# Patient Record
Sex: Male | Born: 1995 | Race: White | Hispanic: No
Health system: Southern US, Community
[De-identification: ages and names within clinical notes are randomized; demographics above are authoritative.]

## PROBLEM LIST (undated history)

## (undated) DIAGNOSIS — K219 Gastro-esophageal reflux disease without esophagitis: Secondary | ICD-10-CM

## (undated) HISTORY — DX: Gastro-esophageal reflux disease without esophagitis: K21.9

---
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
## 2019-05-29 ENCOUNTER — Other Ambulatory Visit: Payer: Self-pay

## 2019-05-29 ENCOUNTER — Ambulatory Visit: Payer: BLUE CROSS/BLUE SHIELD | Attending: Internal Medicine

## 2019-05-29 DIAGNOSIS — Z23 Encounter for immunization: Secondary | ICD-10-CM

## 2019-05-29 NOTE — Progress Notes (Signed)
   Covid-19 Vaccination Clinic  Name:  Jaquez Farrington [facility]    MRN: [id number] DOB: 06/24/1995  05/29/2019  Mr. Mckinney was observed post Covid-19 immunization for 15 minutes without incident. He was provided with Vaccine Information Sheet and instruction to access the V-Safe system.   Mr. Lienhard was instructed to call 911 with any severe reactions post vaccine: Marland Kitchen Difficulty breathing  . Swelling of face and throat  . A fast heartbeat  . A bad rash all over body  . Dizziness and weakness   Immunizations Administered    Name Date Dose VIS Date Route   Pfizer COVID-19 Vaccine 05/29/2019  9:43 AM 0.3 mL 02/06/2019 Intramuscular   Manufacturer: [employer], [employer]   Lot: [id number]   NDC: [address]-9

## 2019-06-23 ENCOUNTER — Ambulatory Visit: Payer: BLUE CROSS/BLUE SHIELD | Attending: Internal Medicine

## 2019-06-23 DIAGNOSIS — Z23 Encounter for immunization: Secondary | ICD-10-CM

## 2019-06-23 NOTE — Progress Notes (Signed)
.     Covid-19 Vaccination Clinic  Name:  Bomani Oommen [facility]    MRN: [id number] DOB: 05/01/1995  06/23/2019  Mr. Tunison was observed post Covid-19 immunization for 15 minutes without incident. He was provided with Vaccine Information Sheet and instruction to access the V-Safe system.   Mr. Schwandt was instructed to call 911 with any severe reactions post vaccine: Marland Kitchen Difficulty breathing  . Swelling of face and throat  . A fast heartbeat  . A bad rash all over body  . Dizziness and weakness   Immunizations Administered    Name Date Dose VIS Date Route   Pfizer COVID-19 Vaccine 06/23/2019 11:32 AM 0.3 mL 04/22/2018 Intramuscular   Manufacturer: [employer], [employer]   Lot: [id number]   NDC: [address]-1

## 2021-11-15 DIAGNOSIS — Z03818 Encounter for observation for suspected exposure to other biological agents ruled out: Secondary | ICD-10-CM | POA: Diagnosis not present

## 2021-11-15 DIAGNOSIS — J029 Acute pharyngitis, unspecified: Secondary | ICD-10-CM | POA: Diagnosis not present

## 2021-11-15 DIAGNOSIS — R059 Cough, unspecified: Secondary | ICD-10-CM | POA: Diagnosis not present

## 2021-11-15 DIAGNOSIS — Z6825 Body mass index (BMI) 25.0-25.9, adult: Secondary | ICD-10-CM | POA: Diagnosis not present

## 2022-05-03 DIAGNOSIS — F17210 Nicotine dependence, cigarettes, uncomplicated: Secondary | ICD-10-CM | POA: Diagnosis not present

## 2022-05-03 DIAGNOSIS — R112 Nausea with vomiting, unspecified: Secondary | ICD-10-CM | POA: Diagnosis not present

## 2022-05-03 DIAGNOSIS — S0219XA Other fracture of base of skull, initial encounter for closed fracture: Secondary | ICD-10-CM | POA: Diagnosis not present

## 2022-05-03 DIAGNOSIS — S062X0A Diffuse traumatic brain injury without loss of consciousness, initial encounter: Secondary | ICD-10-CM | POA: Diagnosis not present

## 2022-05-03 DIAGNOSIS — I609 Nontraumatic subarachnoid hemorrhage, unspecified: Secondary | ICD-10-CM | POA: Diagnosis not present

## 2022-05-03 DIAGNOSIS — R519 Headache, unspecified: Secondary | ICD-10-CM | POA: Diagnosis not present

## 2022-05-03 DIAGNOSIS — Z043 Encounter for examination and observation following other accident: Secondary | ICD-10-CM | POA: Diagnosis not present

## 2022-05-03 DIAGNOSIS — Y9351 Activity, roller skating (inline) and skateboarding: Secondary | ICD-10-CM | POA: Diagnosis not present

## 2022-05-03 DIAGNOSIS — S065X0A Traumatic subdural hemorrhage without loss of consciousness, initial encounter: Secondary | ICD-10-CM | POA: Diagnosis not present

## 2022-05-03 DIAGNOSIS — W19XXXA Unspecified fall, initial encounter: Secondary | ICD-10-CM | POA: Diagnosis not present

## 2022-05-03 DIAGNOSIS — S065XAA Traumatic subdural hemorrhage with loss of consciousness status unknown, initial encounter: Secondary | ICD-10-CM | POA: Diagnosis not present

## 2022-05-03 DIAGNOSIS — I611 Nontraumatic intracerebral hemorrhage in hemisphere, cortical: Secondary | ICD-10-CM | POA: Diagnosis not present

## 2022-05-03 DIAGNOSIS — I6200 Nontraumatic subdural hemorrhage, unspecified: Secondary | ICD-10-CM | POA: Diagnosis not present

## 2022-05-03 DIAGNOSIS — S066X0A Traumatic subarachnoid hemorrhage without loss of consciousness, initial encounter: Secondary | ICD-10-CM | POA: Diagnosis not present

## 2022-05-03 DIAGNOSIS — S0291XA Unspecified fracture of skull, initial encounter for closed fracture: Secondary | ICD-10-CM | POA: Diagnosis not present

## 2022-05-04 ENCOUNTER — Telehealth: Payer: Self-pay

## 2022-05-04 DIAGNOSIS — S065XAA Traumatic subdural hemorrhage with loss of consciousness status unknown, initial encounter: Secondary | ICD-10-CM | POA: Diagnosis not present

## 2022-05-04 DIAGNOSIS — I609 Nontraumatic subarachnoid hemorrhage, unspecified: Secondary | ICD-10-CM | POA: Diagnosis not present

## 2022-05-04 DIAGNOSIS — S0291XA Unspecified fracture of skull, initial encounter for closed fracture: Secondary | ICD-10-CM | POA: Diagnosis not present

## 2022-05-04 DIAGNOSIS — I6200 Nontraumatic subdural hemorrhage, unspecified: Secondary | ICD-10-CM | POA: Diagnosis not present

## 2022-05-04 DIAGNOSIS — I611 Nontraumatic intracerebral hemorrhage in hemisphere, cortical: Secondary | ICD-10-CM | POA: Diagnosis not present

## 2022-05-04 NOTE — Telephone Encounter (Signed)
Sent urgent powershare request to [facility], waiting on response.
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
## 2022-05-04 NOTE — Telephone Encounter (Signed)
CT ordered. Eric Roberts the phone # to schedule CT to be done around 05/18/22. Appt made with Eric Roberts for 05/22/22. Eric Roberts that they should return to the ER if he has any worsening symptoms. She is going to try to send a picture of his insurance card through [place] because he has [place] now.

## 2022-05-07 ENCOUNTER — Ambulatory Visit
Admission: RE | Admit: 2022-05-07 | Discharge: 2022-05-07 | Disposition: A | Discharge status: Home / Self Care | Payer: Self-pay | Source: Ambulatory Visit | Attending: Neurosurgery | Admitting: Neurosurgery

## 2022-05-07 ENCOUNTER — Emergency Department: Payer: 59

## 2022-05-07 ENCOUNTER — Emergency Department
Admission: EM | Admit: 2022-05-07 | Discharge: 2022-05-08 | Disposition: A | Discharge status: Home / Self Care | Payer: 59 | Attending: Emergency Medicine | Admitting: Emergency Medicine

## 2022-05-07 ENCOUNTER — Other Ambulatory Visit: Payer: Self-pay

## 2022-05-07 DIAGNOSIS — Y900 Blood alcohol level of less than 20 mg/100 ml: Secondary | ICD-10-CM | POA: Diagnosis not present

## 2022-05-07 DIAGNOSIS — S0990XA Unspecified injury of head, initial encounter: Secondary | ICD-10-CM | POA: Diagnosis not present

## 2022-05-07 DIAGNOSIS — X58XXXA Exposure to other specified factors, initial encounter: Secondary | ICD-10-CM | POA: Insufficient documentation

## 2022-05-07 DIAGNOSIS — R0689 Other abnormalities of breathing: Secondary | ICD-10-CM | POA: Diagnosis not present

## 2022-05-07 DIAGNOSIS — R569 Unspecified convulsions: Secondary | ICD-10-CM | POA: Insufficient documentation

## 2022-05-07 DIAGNOSIS — S0219XA Other fracture of base of skull, initial encounter for closed fracture: Secondary | ICD-10-CM | POA: Diagnosis not present

## 2022-05-07 DIAGNOSIS — I629 Nontraumatic intracranial hemorrhage, unspecified: Secondary | ICD-10-CM

## 2022-05-07 DIAGNOSIS — Z049 Encounter for examination and observation for unspecified reason: Secondary | ICD-10-CM

## 2022-05-07 DIAGNOSIS — R000 Tachycardia, unspecified: Secondary | ICD-10-CM | POA: Insufficient documentation

## 2022-05-07 DIAGNOSIS — R0902 Hypoxemia: Secondary | ICD-10-CM | POA: Diagnosis not present

## 2022-05-07 DIAGNOSIS — Y9351 Activity, roller skating (inline) and skateboarding: Secondary | ICD-10-CM | POA: Diagnosis not present

## 2022-05-07 DIAGNOSIS — S06360A Traumatic hemorrhage of cerebrum, unspecified, without loss of consciousness, initial encounter: Secondary | ICD-10-CM | POA: Diagnosis not present

## 2022-05-07 DIAGNOSIS — R404 Transient alteration of awareness: Secondary | ICD-10-CM | POA: Diagnosis not present

## 2022-05-07 DIAGNOSIS — I10 Essential (primary) hypertension: Secondary | ICD-10-CM | POA: Diagnosis not present

## 2022-05-07 DIAGNOSIS — R4182 Altered mental status, unspecified: Secondary | ICD-10-CM | POA: Diagnosis not present

## 2022-05-07 LAB — CBC WITH DIFFERENTIAL/PLATELET
Abs Immature Granulocytes: 0.09 10*3/uL — ABNORMAL HIGH (ref 0.00–0.07)
Basophils Absolute: 0.1 10*3/uL (ref 0.0–0.1)
Basophils Relative: 1 %
Eosinophils Absolute: 0.1 10*3/uL (ref 0.0–0.5)
Eosinophils Relative: 1 %
HCT: 52.5 % — ABNORMAL HIGH (ref 39.0–52.0)
Hemoglobin: 17.9 g/dL — ABNORMAL HIGH (ref 13.0–17.0)
Immature Granulocytes: 1 %
Lymphocytes Relative: 21 %
Lymphs Abs: 2.4 10*3/uL (ref 0.7–4.0)
MCH: 31.1 pg (ref 26.0–34.0)
MCHC: 34.1 g/dL (ref 30.0–36.0)
MCV: 91.1 fL (ref 80.0–100.0)
Monocytes Absolute: 0.9 10*3/uL (ref 0.1–1.0)
Monocytes Relative: 8 %
Neutro Abs: 7.6 10*3/uL (ref 1.7–7.7)
Neutrophils Relative %: 68 %
Platelets: 351 10*3/uL (ref 150–400)
RBC: 5.76 MIL/uL (ref 4.22–5.81)
RDW: 11.4 % — ABNORMAL LOW (ref 11.5–15.5)
WBC: 11.1 10*3/uL — ABNORMAL HIGH (ref 4.0–10.5)
nRBC: 0 % (ref 0.0–0.2)

## 2022-05-07 LAB — COMPREHENSIVE METABOLIC PANEL
ALT: 25 U/L (ref 0–44)
AST: 45 U/L — ABNORMAL HIGH (ref 15–41)
Albumin: 4.2 g/dL (ref 3.5–5.0)
Alkaline Phosphatase: 70 U/L (ref 38–126)
Anion gap: 25 — ABNORMAL HIGH (ref 5–15)
BUN: 17 mg/dL (ref 6–20)
CO2: 12 mmol/L — ABNORMAL LOW (ref 22–32)
Calcium: 9.5 mg/dL (ref 8.9–10.3)
Chloride: 95 mmol/L — ABNORMAL LOW (ref 98–111)
Creatinine, Ser: 1.36 mg/dL — ABNORMAL HIGH (ref 0.61–1.24)
GFR, Estimated: >60 mL/min (ref >60)
Glucose, Bld: 139 mg/dL — ABNORMAL HIGH (ref 70–99)
Potassium: 3.2 mmol/L — ABNORMAL LOW (ref 3.5–5.1)
Sodium: 132 mmol/L — ABNORMAL LOW (ref 135–145)
Total Bilirubin: 1.8 mg/dL — ABNORMAL HIGH (ref 0.3–1.2)
Total Protein: 8.1 g/dL (ref 6.5–8.1)

## 2022-05-07 LAB — LACTIC ACID, PLASMA: Lactic Acid, Venous: >9 mmol/L (ref 0.5–1.9)

## 2022-05-07 LAB — ETHANOL: Alcohol, Ethyl (B): <10 mg/dL (ref <10)

## 2022-05-07 LAB — MAGNESIUM: Magnesium: 2.2 mg/dL (ref 1.7–2.4)

## 2022-05-07 MED ORDER — LEVETIRACETAM 500 MG PO TABS: 500.0000 mg | ORAL_TABLET | Freq: Two times a day (BID) | ORAL | 3 refills | Status: AC

## 2022-05-07 MED ORDER — ACETAMINOPHEN 500 MG PO TABS
1000.0000 mg | ORAL_TABLET | Freq: Once | ORAL | Status: AC
  Administered 2022-05-07: 1000 mg via ORAL
  Filled 2022-05-07: qty 2

## 2022-05-07 MED ORDER — SODIUM CHLORIDE 0.9 % IV SOLN: 2000.0000 mg | Freq: Once | INTRAVENOUS | Status: DC

## 2022-05-07 MED ORDER — OXYCODONE HCL 5 MG PO TABS
5.0000 mg | ORAL_TABLET | Freq: Once | ORAL | Status: AC
  Administered 2022-05-07: 5 mg via ORAL
  Filled 2022-05-07: qty 1

## 2022-05-07 MED ORDER — POTASSIUM CHLORIDE CRYS ER 20 MEQ PO TBCR
40.0000 meq | EXTENDED_RELEASE_TABLET | Freq: Once | ORAL | Status: AC
  Administered 2022-05-07: 40 meq via ORAL
  Filled 2022-05-07: qty 2

## 2022-05-07 MED ORDER — LEVETIRACETAM IN NACL 1000 MG/100ML IV SOLN
1000.0000 mg | Freq: Once | INTRAVENOUS | Status: AC
  Administered 2022-05-07: 1000 mg via INTRAVENOUS
  Filled 2022-05-07: qty 100

## 2022-05-07 NOTE — ED Notes (Signed)
Patient transported to CT 

## 2022-05-07 NOTE — ED Triage Notes (Signed)
Pt arrives via EMS from home for ongoing seizures. Pt sustained head injury with bleed last Thursday skateboarding. EMS said family reports 4 seizures prior to their arrival. EMS witnessed seizure 1 minute in duration and gave '5mg'$  versed IVP.  Pt arrives post ictal and confused. EMS reports glucose 117
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
## 2022-05-07 NOTE — Discharge Instructions (Addendum)
Do not drive, swim, use the bathtub, or do any other activities that may be dangerous if you lose consciousness or have another seizure.  Take Keppra which is an antiseizure medications twice a day as prescribed until you are able to follow-up with your neurologist and neurosurgeon.  Stay well-hydrated by drinking plenty of fluids.    Do not use drugs or alcohol.  Thank you for choosing [place] for your health care today!  Please see your primary doctor this week for a follow up appointment.   Sometimes, in the early stages of certain disease courses it is difficult to detect in the emergency department evaluation -- so, it is important that you continue to monitor your symptoms and call your doctor right away or return to the emergency department if you develop any new or worsening symptoms.  Please go to the following website to schedule new (and existing) patient appointments:   [email or web address]  If you do not have a primary doctor try calling the following clinics to establish care:  If you have insurance:  [facility] [phone] [place] [address]   [facility]  [phone] [address] [place] [address]   If you do not have insurance:  [facility]  [phone] [address] [place] [address]   The following is another list of primary care offices in the area who are accepting new patients at this time.  Please reach out to one of them directly and let them know you would like to schedule an appointment to follow up on an Emergency Department visit, and/or to establish a new primary care provider (PCP).  There are likely other primary care clinics in the are who are accepting new patients, but this is an excellent place to start:  [place] physician: Dr Lavon Paganini [address] [place], [place] [address] [phone]  [facility] Lead Physician: Dr Steele Sizer [address], [place], [place] [address] [phone]  [place] Physician: Dr Park Liter [address] [place], [place] [address] [phone]  [facility] Lead Physician: Dr Dewaine Oats [place], [place], [place] [address] [phone]  [facility] Physician: Dr Halina Maidens [address] [place], [place] [address] [phone]   It was my pleasure to care for you today.   [place] Brunette Jacelyn Grip, MD

## 2022-05-07 NOTE — Telephone Encounter (Signed)
Noted. I will work on the authorization.

## 2022-05-08 NOTE — Telephone Encounter (Signed)
I see he went to the ER 05/07/22 and had a repeat head CT at that time. They called Dr Izora Ribas about him.

## 2022-05-09 DIAGNOSIS — Z872 Personal history of diseases of the skin and subcutaneous tissue: Secondary | ICD-10-CM | POA: Diagnosis not present

## 2022-05-09 DIAGNOSIS — R569 Unspecified convulsions: Secondary | ICD-10-CM | POA: Diagnosis not present

## 2022-05-09 DIAGNOSIS — S0636AD Traumatic hemorrhage of cerebrum, unspecified, with loss of consciousness status unknown, subsequent encounter: Secondary | ICD-10-CM | POA: Diagnosis not present

## 2022-05-09 NOTE — Progress Notes (Signed)
Referring Physician:  No referring provider defined for this encounter.  Primary Physician:  Patient, No Pcp Per  History of Present Illness: 05/22/2022 [name] has a history of GERD.  Seen in ED on 05/07/22 for multiple seizures. He was seen in ED at [facility] on 05/04/22 for nondisplaced right sided skull fracture and intracranial bleeding right frontal lobe neuralgic contusion along with subdural and subarachnoid hemorrhage.   He was discharged on keppra and had repeat head CT. He was to follow up with neurology. He is here for follow up of head CT.   He saw neurology on 05/16/22 Eric Roberts)- he was to continue keppra and was started on nortriptyline.   He continues with a dull headache and intermittent dizziness. No vision changes. He has some intermittent neck and jaw pain. Overall, he is feeling much better. He noticed a lot of improvement after starting amitriptyline.   He smokes/vapes occasionally.   Review of Systems:  A 10 point review of systems is negative, except for the pertinent positives and negatives detailed in the HPI.  Past Medical History: Past Medical History:  Diagnosis Date   Gastroesophageal reflux     Past Surgical History: No past surgical history on file.  Allergies: Allergies as of 05/22/2022   (No Known Allergies)    Medications: Outpatient Encounter Medications as of 05/22/2022  Medication Sig   levETIRAcetam (KEPPRA) 500 MG tablet Take 1 tablet (500 mg total) by mouth 2 (two) times daily.   nortriptyline (PAMELOR) 10 MG capsule Take 10 mg at night for three weeks then increase to 20 mg at night and continue that dose   ondansetron (ZOFRAN) 4 MG tablet Take by mouth.   [DISCONTINUED] levETIRAcetam (KEPPRA) 500 MG tablet Take by mouth.   [EXPIRED] ondansetron (ZOFRAN-ODT) 4 MG disintegrating tablet Take 4 mg by mouth every 8 (eight) hours as needed.   [EXPIRED] oxyCODONE (OXY IR/ROXICODONE) 5 MG immediate release tablet Take 5 mg by mouth  every 4 (four) hours as needed.   No facility-administered encounter medications on file as of 05/22/2022.    Social History: Social History   Tobacco Use   Smoking status: Some Days    Packs/day: .25    Types: Cigarettes   Smokeless tobacco: Current   Tobacco comments:    Vape sometimes  Substance Use Topics   Alcohol use: No   Drug use: No    Family Medical History: Family History  Problem Relation Age of Onset   Cholelithiasis Mother    Pancreatitis Mother     Physical Examination: Vitals:   05/22/22 1256  BP: 121/78  Pulse: 94  SpO2: 98%    General: Patient is well developed, well nourished, calm, collected, and in no apparent distress. Attention to examination is appropriate.  Respiratory: Patient is breathing without any difficulty.   NEUROLOGICAL:     Awake, alert, oriented to person, place, and time.  Speech is clear and fluent. Fund of knowledge is appropriate.   Cranial Nerves: Pupils equal round and reactive to light.  Facial tone is symmetric.    Extraocular muscles are intact  Facial sensation is intact bilaterally  Facial strength is intact bilaterally  Tongue protrudes midline   No abnormal lesions on exposed skin.   Strength: Side Biceps Triceps Deltoid Interossei Grip Wrist Ext. Wrist Flex.  R 5 5 5 5 5 5 5   L 5 5 5 5 5 5 5    Side Iliopsoas Quads Hamstring PF DF EHL  R 5 5  5 5 5 5   L 5 5 5 5 5 5    Reflexes are 2+ and symmetric at the biceps, triceps, brachioradialis, patella and achilles.   Hoffman's is absent.  Clonus is not present.   Bilateral upper and lower extremity sensation is intact to light touch.     Gait is normal.    Medical Decision Making  Imaging: CT head dated 05/15/22:  FINDINGS: Brain: Interval mild decrease in conspicuity and hyperdensity of multiple hemorrhagic contusions. No evidence of progressive hemorrhage or mass effect. No evidence of acute large vascular territory infarct, hydrocephalus or mass  lesion.   Vascular: No hyperdense vessel.   Skull: Redemonstrated nondisplaced right temporoparietal fracture.   Sinuses/Orbits: Largely clear sinuses.  No acute orbital findings.   Other: No mastoid effusions.   IMPRESSION: Interval mild decrease in conspicuity and hyperdensity of multiple hemorrhagic contusions, compatible with expected evolution. No evidence of progressive hemorrhage or mass effect.     Electronically Signed   By: Margaretha Sheffield M.D.   On: 05/15/2022 15:38  I have personally reviewed the images and agree with the above interpretation.  Above CT reviewed by Dr. Izora Ribas.   Assessment and Plan: [name] is a pleasant 27 y.o. male has nondisplaced right sided skull fracture and intracranial bleeding right frontal lobe neuralgic contusion along with subdural and subarachnoid hemorrhage s/p skate boarding injury on 05/03/22.   Repeat CT on 05/15/22 looks good and was reviewed with Dr. Izora Ribas.   Treatment options discussed with patient and following plan made with Dr. Izora Ribas:   - No neurosurgical intervention needed.  - Continue to follow with neurology for seizures/concussion.  - He can follow up with [place] prn.   I spent a total of 25 minutes in face-to-face and non-face-to-face activities related to this patient's care today including review of outside records, review of imaging, review of symptoms, physical exam, discussion of differential diagnosis, discussion of treatment options, and documentation.   Thank you for involving me in the care of this patient.   Geronimo Boot PA-C Dept. of Neurosurgery

## 2022-05-15 ENCOUNTER — Ambulatory Visit
Admission: RE | Admit: 2022-05-15 | Discharge: 2022-05-15 | Disposition: A | Discharge status: Home / Self Care | Payer: 59 | Source: Ambulatory Visit | Attending: Neurosurgery | Admitting: Neurosurgery

## 2022-05-15 DIAGNOSIS — S065X0A Traumatic subdural hemorrhage without loss of consciousness, initial encounter: Secondary | ICD-10-CM | POA: Diagnosis not present

## 2022-05-15 DIAGNOSIS — S065XAA Traumatic subdural hemorrhage with loss of consciousness status unknown, initial encounter: Secondary | ICD-10-CM | POA: Diagnosis not present

## 2022-05-16 DIAGNOSIS — R569 Unspecified convulsions: Secondary | ICD-10-CM | POA: Diagnosis not present

## 2022-05-16 DIAGNOSIS — I629 Nontraumatic intracranial hemorrhage, unspecified: Secondary | ICD-10-CM | POA: Diagnosis not present

## 2022-05-16 DIAGNOSIS — G44311 Acute post-traumatic headache, intractable: Secondary | ICD-10-CM | POA: Diagnosis not present

## 2022-05-16 DIAGNOSIS — Z87820 Personal history of traumatic brain injury: Secondary | ICD-10-CM | POA: Diagnosis not present

## 2022-05-16 DIAGNOSIS — F0781 Postconcussional syndrome: Secondary | ICD-10-CM | POA: Diagnosis not present

## 2022-05-16 DIAGNOSIS — R110 Nausea: Secondary | ICD-10-CM | POA: Diagnosis not present

## 2022-05-16 DIAGNOSIS — Z8781 Personal history of (healed) traumatic fracture: Secondary | ICD-10-CM | POA: Diagnosis not present

## 2022-05-22 ENCOUNTER — Encounter: Payer: Self-pay | Admitting: Orthopedic Surgery

## 2022-05-22 ENCOUNTER — Ambulatory Visit: Payer: 59 | Admitting: Orthopedic Surgery

## 2022-05-22 VITALS — BP 121/78 | HR 94 | Ht 65.0 in | Wt 163.0 lb

## 2022-05-22 DIAGNOSIS — S065XAA Traumatic subdural hemorrhage with loss of consciousness status unknown, initial encounter: Secondary | ICD-10-CM

## 2022-05-31 DIAGNOSIS — Z8781 Personal history of (healed) traumatic fracture: Secondary | ICD-10-CM | POA: Diagnosis not present

## 2022-05-31 DIAGNOSIS — R110 Nausea: Secondary | ICD-10-CM | POA: Diagnosis not present

## 2022-05-31 DIAGNOSIS — R569 Unspecified convulsions: Secondary | ICD-10-CM | POA: Diagnosis not present

## 2022-05-31 DIAGNOSIS — G44311 Acute post-traumatic headache, intractable: Secondary | ICD-10-CM | POA: Diagnosis not present

## 2022-05-31 DIAGNOSIS — F0781 Postconcussional syndrome: Secondary | ICD-10-CM | POA: Diagnosis not present

## 2022-05-31 DIAGNOSIS — I629 Nontraumatic intracranial hemorrhage, unspecified: Secondary | ICD-10-CM | POA: Diagnosis not present

## 2022-06-23 DIAGNOSIS — R569 Unspecified convulsions: Secondary | ICD-10-CM | POA: Diagnosis not present

## 2022-07-19 DIAGNOSIS — R569 Unspecified convulsions: Secondary | ICD-10-CM | POA: Diagnosis not present

## 2022-07-30 DIAGNOSIS — R110 Nausea: Secondary | ICD-10-CM | POA: Diagnosis not present

## 2022-07-30 DIAGNOSIS — Z872 Personal history of diseases of the skin and subcutaneous tissue: Secondary | ICD-10-CM | POA: Diagnosis not present

## 2022-07-30 DIAGNOSIS — I629 Nontraumatic intracranial hemorrhage, unspecified: Secondary | ICD-10-CM | POA: Diagnosis not present

## 2022-07-30 DIAGNOSIS — G44311 Acute post-traumatic headache, intractable: Secondary | ICD-10-CM | POA: Diagnosis not present

## 2022-07-30 DIAGNOSIS — S0636AD Traumatic hemorrhage of cerebrum, unspecified, with loss of consciousness status unknown, subsequent encounter: Secondary | ICD-10-CM | POA: Diagnosis not present

## 2022-07-30 DIAGNOSIS — Z8781 Personal history of (healed) traumatic fracture: Secondary | ICD-10-CM | POA: Diagnosis not present

## 2022-07-30 DIAGNOSIS — Z87820 Personal history of traumatic brain injury: Secondary | ICD-10-CM | POA: Diagnosis not present

## 2022-07-30 DIAGNOSIS — R569 Unspecified convulsions: Secondary | ICD-10-CM | POA: Diagnosis not present

## 2022-07-30 DIAGNOSIS — R030 Elevated blood-pressure reading, without diagnosis of hypertension: Secondary | ICD-10-CM | POA: Diagnosis not present

## 2022-07-30 DIAGNOSIS — F0781 Postconcussional syndrome: Secondary | ICD-10-CM | POA: Diagnosis not present

## 2022-08-06 DIAGNOSIS — L720 Epidermal cyst: Secondary | ICD-10-CM | POA: Diagnosis not present

## 2022-08-27 DIAGNOSIS — L720 Epidermal cyst: Secondary | ICD-10-CM | POA: Diagnosis not present

## 2022-09-10 DIAGNOSIS — L720 Epidermal cyst: Secondary | ICD-10-CM | POA: Diagnosis not present

## 2023-03-08 DIAGNOSIS — Z1152 Encounter for screening for COVID-19: Secondary | ICD-10-CM | POA: Diagnosis not present

## 2023-03-08 DIAGNOSIS — R030 Elevated blood-pressure reading, without diagnosis of hypertension: Secondary | ICD-10-CM | POA: Diagnosis not present

## 2023-03-08 DIAGNOSIS — J029 Acute pharyngitis, unspecified: Secondary | ICD-10-CM | POA: Diagnosis not present

## 2023-03-08 DIAGNOSIS — Z6827 Body mass index (BMI) 27.0-27.9, adult: Secondary | ICD-10-CM | POA: Diagnosis not present

## 2023-03-08 DIAGNOSIS — J069 Acute upper respiratory infection, unspecified: Secondary | ICD-10-CM | POA: Diagnosis not present

## 2023-04-16 DIAGNOSIS — M791 Myalgia, unspecified site: Secondary | ICD-10-CM | POA: Diagnosis not present

## 2023-04-16 DIAGNOSIS — Z1152 Encounter for screening for COVID-19: Secondary | ICD-10-CM | POA: Diagnosis not present

## 2023-04-16 DIAGNOSIS — Z6827 Body mass index (BMI) 27.0-27.9, adult: Secondary | ICD-10-CM | POA: Diagnosis not present

## 2023-04-16 DIAGNOSIS — J09X2 Influenza due to identified novel influenza A virus with other respiratory manifestations: Secondary | ICD-10-CM | POA: Diagnosis not present

## 2023-06-03 DIAGNOSIS — Z87820 Personal history of traumatic brain injury: Secondary | ICD-10-CM | POA: Diagnosis not present

## 2023-06-03 DIAGNOSIS — M542 Cervicalgia: Secondary | ICD-10-CM | POA: Diagnosis not present

## 2023-06-03 DIAGNOSIS — R569 Unspecified convulsions: Secondary | ICD-10-CM | POA: Diagnosis not present

## 2023-06-03 DIAGNOSIS — Z8781 Personal history of (healed) traumatic fracture: Secondary | ICD-10-CM | POA: Diagnosis not present

## 2023-06-03 DIAGNOSIS — R519 Headache, unspecified: Secondary | ICD-10-CM | POA: Diagnosis not present

## 2023-07-30 DIAGNOSIS — Z1331 Encounter for screening for depression: Secondary | ICD-10-CM | POA: Diagnosis not present

## 2023-07-30 DIAGNOSIS — R030 Elevated blood-pressure reading, without diagnosis of hypertension: Secondary | ICD-10-CM | POA: Diagnosis not present

## 2023-07-30 DIAGNOSIS — S0636AD Traumatic hemorrhage of cerebrum, unspecified, with loss of consciousness status unknown, subsequent encounter: Secondary | ICD-10-CM | POA: Diagnosis not present

## 2023-07-30 DIAGNOSIS — Z0000 Encounter for general adult medical examination without abnormal findings: Secondary | ICD-10-CM | POA: Diagnosis not present
